# Patient Record
Sex: Female | Born: 1960 | Race: Black or African American | Hispanic: No | Marital: Single | Smoking: Current every day smoker
Health system: Southern US, Community
[De-identification: ages and names within clinical notes are randomized; demographics above are authoritative.]

## PROBLEM LIST (undated history)

## (undated) DIAGNOSIS — I10 Essential (primary) hypertension: Secondary | ICD-10-CM

## (undated) DIAGNOSIS — F341 Dysthymic disorder: Secondary | ICD-10-CM

## (undated) DIAGNOSIS — Z5189 Encounter for other specified aftercare: Secondary | ICD-10-CM

## (undated) DIAGNOSIS — E079 Disorder of thyroid, unspecified: Secondary | ICD-10-CM

## (undated) DIAGNOSIS — D649 Anemia, unspecified: Secondary | ICD-10-CM

## (undated) HISTORY — DX: Encounter for other specified aftercare: Z51.89

## (undated) HISTORY — PX: FRACTURE SURGERY: SHX138

## (undated) HISTORY — DX: Anemia, unspecified: D64.9

## (undated) HISTORY — DX: Dysthymic disorder: F34.1

## (undated) HISTORY — DX: Disorder of thyroid, unspecified: E07.9

## (undated) HISTORY — DX: Essential (primary) hypertension: I10

## (undated) HISTORY — PX: EYE SURGERY: SHX253

---

## 2003-10-18 ENCOUNTER — Other Ambulatory Visit: Admission: RE | Admit: 2003-10-18 | Discharge: 2003-10-18 | Payer: Self-pay | Admitting: Family Medicine

## 2003-11-11 ENCOUNTER — Ambulatory Visit (HOSPITAL_COMMUNITY): Admission: RE | Admit: 2003-11-11 | Discharge: 2003-11-11 | Payer: Self-pay | Admitting: Family Medicine

## 2003-12-26 ENCOUNTER — Ambulatory Visit (HOSPITAL_COMMUNITY): Admission: RE | Admit: 2003-12-26 | Discharge: 2003-12-26 | Payer: Self-pay | Admitting: Internal Medicine

## 2004-02-24 ENCOUNTER — Ambulatory Visit (HOSPITAL_COMMUNITY): Admission: RE | Admit: 2004-02-24 | Discharge: 2004-02-24 | Payer: Self-pay | Admitting: Nurse Practitioner

## 2004-03-28 ENCOUNTER — Encounter: Admission: RE | Admit: 2004-03-28 | Discharge: 2004-05-11 | Payer: Self-pay | Admitting: Nurse Practitioner

## 2004-06-25 ENCOUNTER — Encounter (INDEPENDENT_AMBULATORY_CARE_PROVIDER_SITE_OTHER): Payer: Self-pay | Admitting: Nurse Practitioner

## 2004-06-25 LAB — CONVERTED CEMR LAB: TSH: 0.743 microintl units/mL

## 2004-08-09 ENCOUNTER — Ambulatory Visit: Payer: Self-pay | Admitting: Nurse Practitioner

## 2004-08-13 ENCOUNTER — Ambulatory Visit: Payer: Self-pay | Admitting: Nurse Practitioner

## 2004-08-20 ENCOUNTER — Ambulatory Visit: Payer: Self-pay | Admitting: Nurse Practitioner

## 2004-08-27 ENCOUNTER — Ambulatory Visit: Payer: Self-pay | Admitting: Nurse Practitioner

## 2004-10-31 ENCOUNTER — Other Ambulatory Visit: Admission: RE | Admit: 2004-10-31 | Discharge: 2004-10-31 | Payer: Self-pay | Admitting: Family Medicine

## 2004-10-31 ENCOUNTER — Ambulatory Visit: Payer: Self-pay | Admitting: Nurse Practitioner

## 2004-10-31 LAB — CONVERTED CEMR LAB

## 2004-11-05 ENCOUNTER — Ambulatory Visit: Payer: Self-pay | Admitting: Nurse Practitioner

## 2004-11-07 ENCOUNTER — Ambulatory Visit (HOSPITAL_COMMUNITY): Admission: RE | Admit: 2004-11-07 | Discharge: 2004-11-07 | Payer: Self-pay | Admitting: Nurse Practitioner

## 2004-11-07 ENCOUNTER — Ambulatory Visit: Payer: Self-pay | Admitting: Nurse Practitioner

## 2004-11-14 ENCOUNTER — Ambulatory Visit (HOSPITAL_COMMUNITY): Admission: RE | Admit: 2004-11-14 | Discharge: 2004-11-14 | Payer: Self-pay | Admitting: Family Medicine

## 2004-11-19 ENCOUNTER — Ambulatory Visit: Payer: Self-pay | Admitting: Internal Medicine

## 2004-12-03 ENCOUNTER — Ambulatory Visit (HOSPITAL_COMMUNITY): Admission: RE | Admit: 2004-12-03 | Discharge: 2004-12-03 | Payer: Self-pay | Admitting: Internal Medicine

## 2005-05-13 ENCOUNTER — Ambulatory Visit: Payer: Self-pay | Admitting: Family Medicine

## 2005-06-03 ENCOUNTER — Ambulatory Visit: Payer: Self-pay | Admitting: Nurse Practitioner

## 2005-06-24 ENCOUNTER — Ambulatory Visit: Payer: Self-pay | Admitting: Nurse Practitioner

## 2005-07-09 ENCOUNTER — Ambulatory Visit: Payer: Self-pay | Admitting: Nurse Practitioner

## 2005-08-05 ENCOUNTER — Ambulatory Visit: Payer: Self-pay | Admitting: Nurse Practitioner

## 2005-12-30 ENCOUNTER — Ambulatory Visit: Payer: Self-pay | Admitting: Nurse Practitioner

## 2006-02-03 ENCOUNTER — Ambulatory Visit (HOSPITAL_COMMUNITY): Admission: RE | Admit: 2006-02-03 | Discharge: 2006-02-03 | Payer: Self-pay | Admitting: Family Medicine

## 2006-02-10 ENCOUNTER — Ambulatory Visit: Payer: Self-pay | Admitting: Nurse Practitioner

## 2006-02-10 ENCOUNTER — Other Ambulatory Visit: Admission: RE | Admit: 2006-02-10 | Discharge: 2006-02-10 | Payer: Self-pay | Admitting: Family Medicine

## 2006-02-10 LAB — CONVERTED CEMR LAB: Urinalysis: ABNORMAL

## 2006-02-11 ENCOUNTER — Encounter (INDEPENDENT_AMBULATORY_CARE_PROVIDER_SITE_OTHER): Payer: Self-pay | Admitting: Nurse Practitioner

## 2007-02-06 ENCOUNTER — Ambulatory Visit (HOSPITAL_COMMUNITY): Admission: RE | Admit: 2007-02-06 | Discharge: 2007-02-06 | Payer: Self-pay | Admitting: Family Medicine

## 2007-05-26 ENCOUNTER — Encounter (INDEPENDENT_AMBULATORY_CARE_PROVIDER_SITE_OTHER): Payer: Self-pay | Admitting: Nurse Practitioner

## 2007-05-26 DIAGNOSIS — B159 Hepatitis A without hepatic coma: Secondary | ICD-10-CM | POA: Insufficient documentation

## 2007-05-26 DIAGNOSIS — I10 Essential (primary) hypertension: Secondary | ICD-10-CM

## 2007-05-26 DIAGNOSIS — A539 Syphilis, unspecified: Secondary | ICD-10-CM

## 2007-05-26 DIAGNOSIS — Z8619 Personal history of other infectious and parasitic diseases: Secondary | ICD-10-CM

## 2007-09-26 ENCOUNTER — Emergency Department (HOSPITAL_COMMUNITY): Admission: EM | Admit: 2007-09-26 | Discharge: 2007-09-26 | Payer: Self-pay | Admitting: Emergency Medicine

## 2007-12-25 ENCOUNTER — Encounter (INDEPENDENT_AMBULATORY_CARE_PROVIDER_SITE_OTHER): Payer: Self-pay | Admitting: Nurse Practitioner

## 2007-12-25 ENCOUNTER — Ambulatory Visit: Payer: Self-pay | Admitting: Family Medicine

## 2007-12-25 LAB — CONVERTED CEMR LAB
Albumin: 4.4 g/dL (ref 3.5–5.2)
BUN: 12 mg/dL (ref 6–23)
Calcium: 9.6 mg/dL (ref 8.4–10.5)
Chloride: 101 meq/L (ref 96–112)
Eosinophils Absolute: 0.1 10*3/uL (ref 0.0–0.7)
Glucose, Bld: 92 mg/dL (ref 70–99)
Lymphs Abs: 1.4 10*3/uL (ref 0.7–4.0)
MCV: 75.7 fL — ABNORMAL LOW (ref 78.0–100.0)
Monocytes Relative: 9 % (ref 3–12)
Neutro Abs: 3 10*3/uL (ref 1.7–7.7)
Neutrophils Relative %: 62 % (ref 43–77)
Potassium: 3.7 meq/L (ref 3.5–5.3)
RBC: 4.69 M/uL (ref 3.87–5.11)
RPR Ser Ql: REACTIVE — AB
RPR Titer: 1:4 {titer}
TSH: 0.734 microintl units/mL (ref 0.350–5.50)
WBC: 4.9 10*3/uL (ref 4.0–10.5)

## 2008-01-15 ENCOUNTER — Ambulatory Visit: Payer: Self-pay | Admitting: Internal Medicine

## 2008-01-22 ENCOUNTER — Ambulatory Visit: Payer: Self-pay | Admitting: Internal Medicine

## 2008-02-05 ENCOUNTER — Ambulatory Visit: Payer: Self-pay | Admitting: Internal Medicine

## 2008-02-19 ENCOUNTER — Ambulatory Visit (HOSPITAL_COMMUNITY): Admission: RE | Admit: 2008-02-19 | Discharge: 2008-02-19 | Payer: Self-pay | Admitting: Family Medicine

## 2008-03-04 ENCOUNTER — Encounter (INDEPENDENT_AMBULATORY_CARE_PROVIDER_SITE_OTHER): Payer: Self-pay | Admitting: Nurse Practitioner

## 2008-03-04 ENCOUNTER — Other Ambulatory Visit: Admission: RE | Admit: 2008-03-04 | Discharge: 2008-03-04 | Payer: Self-pay | Admitting: Family Medicine

## 2008-03-04 ENCOUNTER — Ambulatory Visit: Payer: Self-pay | Admitting: Family Medicine

## 2008-03-04 LAB — CONVERTED CEMR LAB
ALT: 11 units/L (ref 0–35)
AST: 16 units/L (ref 0–37)
Basophils Absolute: 0 10*3/uL (ref 0.0–0.1)
CO2: 23 meq/L (ref 19–32)
Calcium: 9.5 mg/dL (ref 8.4–10.5)
Chloride: 106 meq/L (ref 96–112)
Lymphocytes Relative: 37 % (ref 12–46)
Neutro Abs: 2.3 10*3/uL (ref 1.7–7.7)
Platelets: 309 10*3/uL (ref 150–400)
RDW: 18.4 % — ABNORMAL HIGH (ref 11.5–15.5)
Sodium: 141 meq/L (ref 135–145)
TSH: 0.334 microintl units/mL — ABNORMAL LOW (ref 0.350–5.50)
Total Bilirubin: 0.4 mg/dL (ref 0.3–1.2)
Total Protein: 7.1 g/dL (ref 6.0–8.3)

## 2008-03-11 ENCOUNTER — Encounter: Admission: RE | Admit: 2008-03-11 | Discharge: 2008-03-11 | Payer: Self-pay | Admitting: Family Medicine

## 2008-09-10 ENCOUNTER — Emergency Department (HOSPITAL_COMMUNITY): Admission: EM | Admit: 2008-09-10 | Discharge: 2008-09-10 | Payer: Self-pay | Admitting: Emergency Medicine

## 2008-12-29 ENCOUNTER — Emergency Department (HOSPITAL_COMMUNITY): Admission: EM | Admit: 2008-12-29 | Discharge: 2008-12-29 | Payer: Self-pay | Admitting: Emergency Medicine

## 2009-01-18 ENCOUNTER — Ambulatory Visit: Payer: Self-pay | Admitting: Internal Medicine

## 2009-01-18 ENCOUNTER — Encounter (INDEPENDENT_AMBULATORY_CARE_PROVIDER_SITE_OTHER): Payer: Self-pay | Admitting: Internal Medicine

## 2009-01-18 LAB — CONVERTED CEMR LAB
ALT: 9 units/L (ref 0–35)
AST: 12 units/L (ref 0–37)
Albumin: 4.2 g/dL (ref 3.5–5.2)
Alkaline Phosphatase: 84 units/L (ref 39–117)
BUN: 11 mg/dL (ref 6–23)
Basophils Relative: 0 % (ref 0–1)
CO2: 23 meq/L (ref 19–32)
Creatinine, Ser: 0.91 mg/dL (ref 0.40–1.20)
Eosinophils Absolute: 0.1 10*3/uL (ref 0.0–0.7)
Eosinophils Relative: 2 % (ref 0–5)
Glucose, Bld: 94 mg/dL (ref 70–99)
HCT: 29.8 % — ABNORMAL LOW (ref 36.0–46.0)
MCHC: 31.2 g/dL (ref 30.0–36.0)
MCV: 71.6 fL — ABNORMAL LOW (ref 78.0–100.0)
Monocytes Absolute: 0.3 10*3/uL (ref 0.1–1.0)
Monocytes Relative: 7 % (ref 3–12)
Neutrophils Relative %: 60 % (ref 43–77)
RBC: 4.16 M/uL (ref 3.87–5.11)
Total Bilirubin: 0.3 mg/dL (ref 0.3–1.2)
Total Protein: 6.6 g/dL (ref 6.0–8.3)

## 2009-01-26 ENCOUNTER — Ambulatory Visit: Payer: Self-pay | Admitting: Internal Medicine

## 2009-02-09 ENCOUNTER — Ambulatory Visit: Payer: Self-pay | Admitting: Internal Medicine

## 2009-02-09 ENCOUNTER — Encounter (INDEPENDENT_AMBULATORY_CARE_PROVIDER_SITE_OTHER): Payer: Self-pay | Admitting: Internal Medicine

## 2009-02-15 ENCOUNTER — Ambulatory Visit (HOSPITAL_COMMUNITY): Admission: RE | Admit: 2009-02-15 | Discharge: 2009-02-15 | Payer: Self-pay | Admitting: Internal Medicine

## 2009-02-20 ENCOUNTER — Encounter: Admission: RE | Admit: 2009-02-20 | Discharge: 2009-02-20 | Payer: Self-pay | Admitting: *Deleted

## 2009-02-24 ENCOUNTER — Ambulatory Visit: Payer: Self-pay | Admitting: Internal Medicine

## 2009-02-24 ENCOUNTER — Encounter (INDEPENDENT_AMBULATORY_CARE_PROVIDER_SITE_OTHER): Payer: Self-pay | Admitting: Internal Medicine

## 2009-02-24 LAB — CONVERTED CEMR LAB
Basophils Absolute: 0 K/uL
Basophils Relative: 0 %
Eosinophils Absolute: 0.2 K/uL
Eosinophils Relative: 3 %
HCT: 25.3 % — ABNORMAL LOW
Hemoglobin: 7.6 g/dL — CL
Lymphocytes Relative: 21 %
Lymphs Abs: 1.4 K/uL
MCHC: 30 g/dL
MCV: 77.6 fL — ABNORMAL LOW
Monocytes Absolute: 0.6 K/uL
Monocytes Relative: 9 %
Neutro Abs: 4.6 K/uL
Neutrophils Relative %: 68 %
Platelets: 277 K/uL
RBC: 3.26 M/uL — ABNORMAL LOW
RDW: 22.3 % — ABNORMAL HIGH
WBC: 6.8 10*3/microliter

## 2009-02-28 ENCOUNTER — Encounter (INDEPENDENT_AMBULATORY_CARE_PROVIDER_SITE_OTHER): Payer: Self-pay | Admitting: Internal Medicine

## 2009-02-28 ENCOUNTER — Ambulatory Visit: Payer: Self-pay | Admitting: Internal Medicine

## 2009-02-28 LAB — CONVERTED CEMR LAB
Basophils Relative: 0 % (ref 0–1)
Eosinophils Absolute: 0.1 10*3/uL (ref 0.0–0.7)
MCHC: 32.8 g/dL (ref 30.0–36.0)
MCV: 73.6 fL — ABNORMAL LOW (ref 78.0–100.0)
Monocytes Relative: 8 % (ref 3–12)
Neutrophils Relative %: 69 % (ref 43–77)
RBC: 3.11 M/uL — ABNORMAL LOW (ref 3.87–5.11)

## 2009-03-02 ENCOUNTER — Emergency Department (HOSPITAL_COMMUNITY): Admission: EM | Admit: 2009-03-02 | Discharge: 2009-03-02 | Payer: Self-pay | Admitting: Emergency Medicine

## 2009-03-08 ENCOUNTER — Other Ambulatory Visit: Admission: RE | Admit: 2009-03-08 | Discharge: 2009-03-08 | Payer: Self-pay | Admitting: Obstetrics & Gynecology

## 2009-03-08 ENCOUNTER — Ambulatory Visit: Payer: Self-pay | Admitting: Obstetrics & Gynecology

## 2009-03-29 ENCOUNTER — Ambulatory Visit: Payer: Self-pay | Admitting: Obstetrics and Gynecology

## 2009-03-29 ENCOUNTER — Ambulatory Visit: Payer: Self-pay | Admitting: Internal Medicine

## 2009-04-05 ENCOUNTER — Ambulatory Visit: Payer: Self-pay | Admitting: Internal Medicine

## 2009-06-02 ENCOUNTER — Ambulatory Visit: Payer: Self-pay | Admitting: Obstetrics & Gynecology

## 2009-06-02 ENCOUNTER — Ambulatory Visit: Payer: Self-pay | Admitting: Internal Medicine

## 2009-07-11 ENCOUNTER — Ambulatory Visit: Payer: Self-pay | Admitting: Internal Medicine

## 2009-07-11 ENCOUNTER — Encounter (INDEPENDENT_AMBULATORY_CARE_PROVIDER_SITE_OTHER): Payer: Self-pay | Admitting: Internal Medicine

## 2009-07-11 LAB — CONVERTED CEMR LAB
Basophils Absolute: 0 10*3/uL (ref 0.0–0.1)
Basophils Relative: 0 % (ref 0–1)
Eosinophils Absolute: 0 10*3/uL (ref 0.0–0.7)
Eosinophils Relative: 1 % (ref 0–5)
HCT: 35.2 % — ABNORMAL LOW (ref 36.0–46.0)
Hemoglobin: 11.7 g/dL — ABNORMAL LOW (ref 12.0–15.0)
MCHC: 33.2 g/dL (ref 30.0–36.0)
Monocytes Absolute: 0.3 10*3/uL (ref 0.1–1.0)
Neutro Abs: 1.8 10*3/uL (ref 1.7–7.7)
RDW: 15.2 % (ref 11.5–15.5)

## 2009-07-25 ENCOUNTER — Ambulatory Visit (HOSPITAL_COMMUNITY): Admission: RE | Admit: 2009-07-25 | Discharge: 2009-07-25 | Payer: Self-pay | Admitting: Internal Medicine

## 2009-07-25 ENCOUNTER — Encounter: Payer: Self-pay | Admitting: Internal Medicine

## 2009-08-10 ENCOUNTER — Encounter (INDEPENDENT_AMBULATORY_CARE_PROVIDER_SITE_OTHER): Payer: Self-pay | Admitting: Internal Medicine

## 2009-08-10 ENCOUNTER — Ambulatory Visit: Payer: Self-pay | Admitting: Internal Medicine

## 2009-08-10 LAB — CONVERTED CEMR LAB
Basophils Relative: 0 % (ref 0–1)
Eosinophils Absolute: 0 10*3/uL (ref 0.0–0.7)
LDL Cholesterol: 128 mg/dL — ABNORMAL HIGH (ref 0–99)
MCHC: 31.4 g/dL (ref 30.0–36.0)
MCV: 85.2 fL (ref 78.0–100.0)
Neutrophils Relative %: 53 % (ref 43–77)
Platelets: 229 10*3/uL (ref 150–400)
Total CHOL/HDL Ratio: 3.8

## 2009-08-25 ENCOUNTER — Encounter (INDEPENDENT_AMBULATORY_CARE_PROVIDER_SITE_OTHER): Payer: Self-pay | Admitting: Internal Medicine

## 2009-08-25 ENCOUNTER — Ambulatory Visit: Payer: Self-pay | Admitting: Internal Medicine

## 2009-08-25 LAB — CONVERTED CEMR LAB
Iron: 19 ug/dL — ABNORMAL LOW (ref 42–145)
Saturation Ratios: 5 % — ABNORMAL LOW (ref 20–55)
UIBC: 378 ug/dL
Vitamin B-12: 262 pg/mL (ref 211–911)

## 2009-08-28 ENCOUNTER — Encounter (INDEPENDENT_AMBULATORY_CARE_PROVIDER_SITE_OTHER): Payer: Self-pay | Admitting: Internal Medicine

## 2009-09-11 ENCOUNTER — Encounter (INDEPENDENT_AMBULATORY_CARE_PROVIDER_SITE_OTHER): Payer: Self-pay | Admitting: Internal Medicine

## 2009-09-11 ENCOUNTER — Ambulatory Visit: Payer: Self-pay | Admitting: Internal Medicine

## 2009-09-27 ENCOUNTER — Ambulatory Visit: Payer: Self-pay | Admitting: Internal Medicine

## 2009-10-25 ENCOUNTER — Ambulatory Visit: Payer: Self-pay | Admitting: Internal Medicine

## 2009-11-21 ENCOUNTER — Ambulatory Visit: Payer: Self-pay | Admitting: Family Medicine

## 2009-11-21 ENCOUNTER — Ambulatory Visit (HOSPITAL_COMMUNITY): Admission: RE | Admit: 2009-11-21 | Discharge: 2009-11-21 | Payer: Self-pay | Admitting: Family Medicine

## 2009-12-21 ENCOUNTER — Ambulatory Visit: Payer: Self-pay | Admitting: Family Medicine

## 2010-01-01 ENCOUNTER — Ambulatory Visit: Payer: Self-pay | Admitting: Family Medicine

## 2010-02-21 ENCOUNTER — Encounter: Admission: RE | Admit: 2010-02-21 | Discharge: 2010-02-21 | Payer: Self-pay | Admitting: Family Medicine

## 2010-02-22 ENCOUNTER — Ambulatory Visit: Payer: Self-pay | Admitting: Internal Medicine

## 2010-02-22 LAB — CONVERTED CEMR LAB
CO2: 26 meq/L (ref 19–32)
Calcium: 10.2 mg/dL (ref 8.4–10.5)
Chloride: 106 meq/L (ref 96–112)
Glucose, Bld: 93 mg/dL (ref 70–99)
Sodium: 141 meq/L (ref 135–145)

## 2010-03-02 ENCOUNTER — Ambulatory Visit: Payer: Self-pay | Admitting: Internal Medicine

## 2010-03-02 LAB — CONVERTED CEMR LAB
ALT: 15 units/L (ref 0–35)
AST: 16 units/L (ref 0–37)
Albumin: 4.3 g/dL (ref 3.5–5.2)
CO2: 25 meq/L (ref 19–32)
Calcium: 9.8 mg/dL (ref 8.4–10.5)
Chloride: 105 meq/L (ref 96–112)
Creatinine, Ser: 0.9 mg/dL (ref 0.40–1.20)
Ferritin: 36 ng/mL (ref 10–291)
Potassium: 3.6 meq/L (ref 3.5–5.3)
Saturation Ratios: 34 % (ref 20–55)
Total Protein: 6.8 g/dL (ref 6.0–8.3)

## 2010-03-09 ENCOUNTER — Ambulatory Visit: Payer: Self-pay | Admitting: Internal Medicine

## 2010-03-19 ENCOUNTER — Ambulatory Visit (HOSPITAL_COMMUNITY): Admission: RE | Admit: 2010-03-19 | Discharge: 2010-03-19 | Payer: Self-pay | Admitting: Internal Medicine

## 2010-03-26 ENCOUNTER — Ambulatory Visit: Payer: Self-pay | Admitting: Internal Medicine

## 2010-04-23 ENCOUNTER — Other Ambulatory Visit: Admission: RE | Admit: 2010-04-23 | Discharge: 2010-04-23 | Payer: Self-pay | Admitting: Internal Medicine

## 2010-04-23 ENCOUNTER — Ambulatory Visit: Payer: Self-pay | Admitting: Internal Medicine

## 2010-04-24 ENCOUNTER — Encounter (INDEPENDENT_AMBULATORY_CARE_PROVIDER_SITE_OTHER): Payer: Self-pay | Admitting: Internal Medicine

## 2010-05-08 ENCOUNTER — Ambulatory Visit: Payer: Self-pay | Admitting: Internal Medicine

## 2010-05-22 ENCOUNTER — Ambulatory Visit: Payer: Self-pay | Admitting: Internal Medicine

## 2010-05-30 ENCOUNTER — Ambulatory Visit: Payer: Self-pay | Admitting: Internal Medicine

## 2010-06-20 ENCOUNTER — Ambulatory Visit: Payer: Self-pay | Admitting: Internal Medicine

## 2010-07-12 ENCOUNTER — Ambulatory Visit: Payer: Self-pay | Admitting: Internal Medicine

## 2010-08-16 ENCOUNTER — Emergency Department (HOSPITAL_COMMUNITY): Admission: EM | Admit: 2010-08-16 | Discharge: 2010-08-16 | Payer: Self-pay | Admitting: Emergency Medicine

## 2010-08-16 ENCOUNTER — Ambulatory Visit: Payer: Self-pay | Admitting: Internal Medicine

## 2010-10-12 ENCOUNTER — Ambulatory Visit: Payer: Self-pay | Admitting: Obstetrics & Gynecology

## 2010-10-18 IMAGING — CT CT HEAD W/O CM
1 of 2 series · 13 of 30 positions shown, 17 images · non-contrast
Comparison: None.

CLINICAL DATA: Migraine headache.

CT HEAD WITHOUT CONTRAST
TECHNIQUE: Contiguous axial images were obtained from the base of
the skull through the vertex without contrast

[Series 2: brain · axial · 0.49mm/px · z∈[+122,+255]mm · 13 of 40 slices shown, 17 images]
[im 3/40  brain]
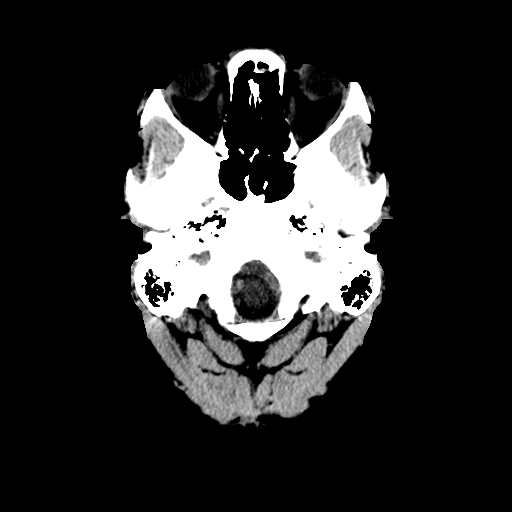
[im 3/40  bone]
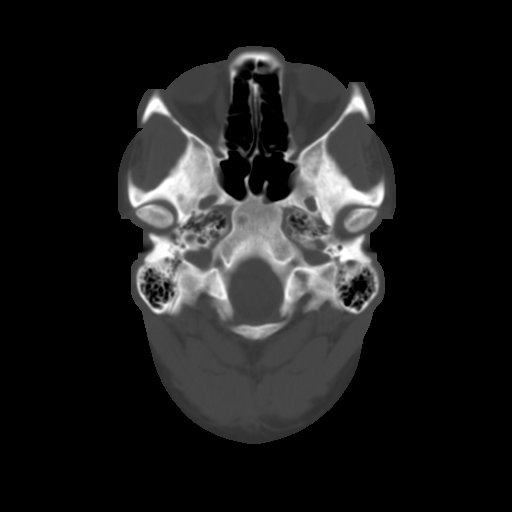
[im 6/40  brain]
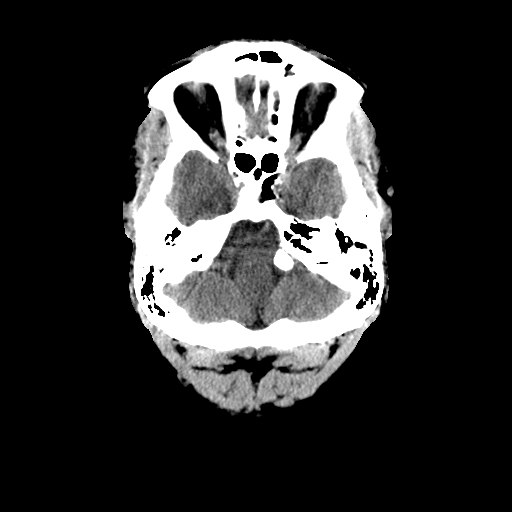
[im 9/40  brain]
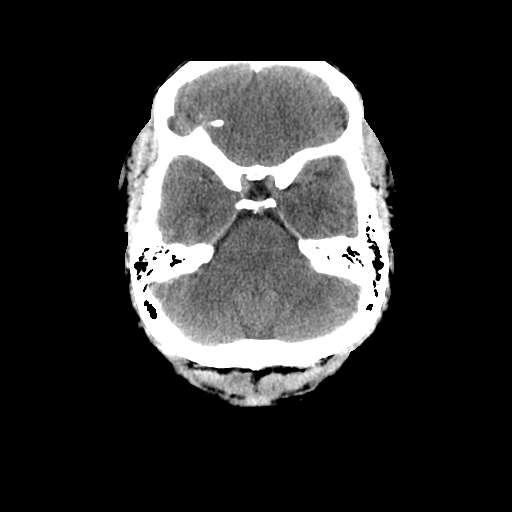
[im 12/40  brain]
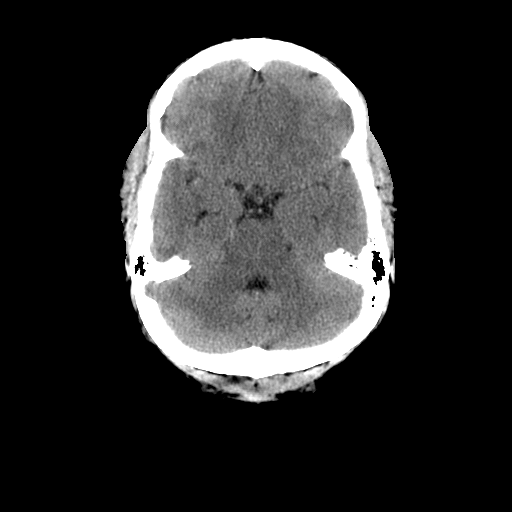
[im 14/40  brain]
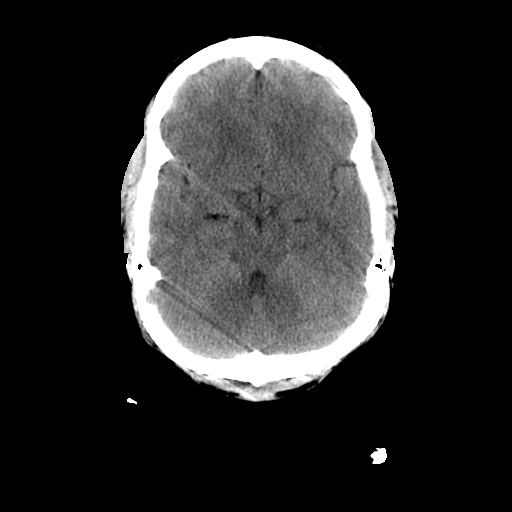
[im 14/40  bone]
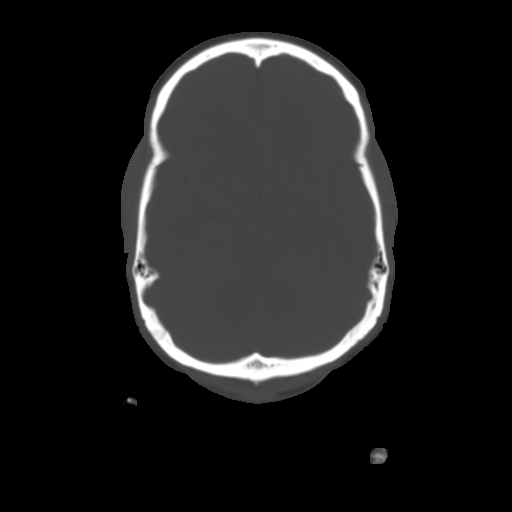
[im 17/40  brain]
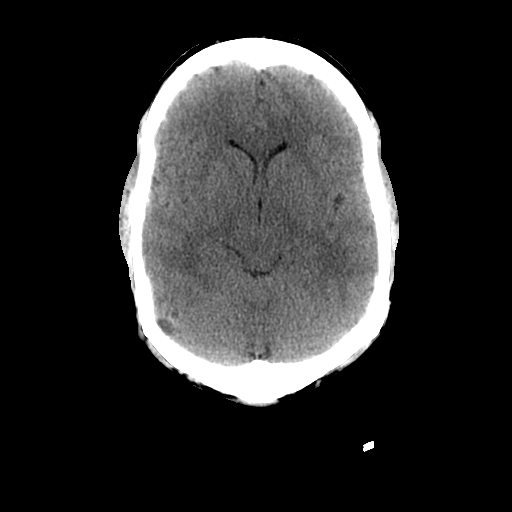
[im 20/40  brain]
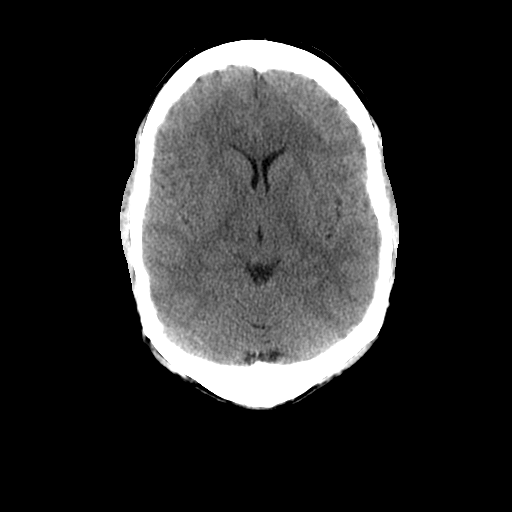
[im 23/40  brain]
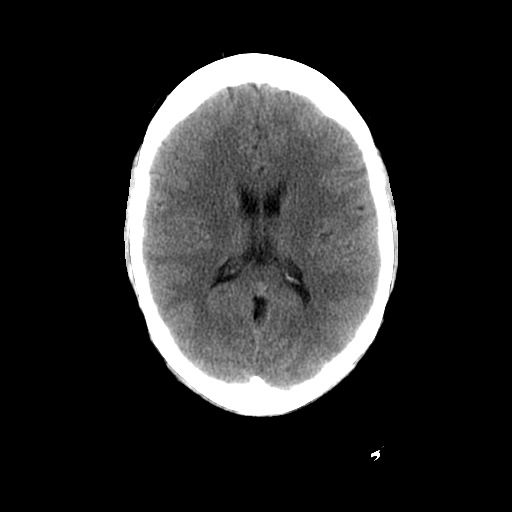
[im 26/40  brain]
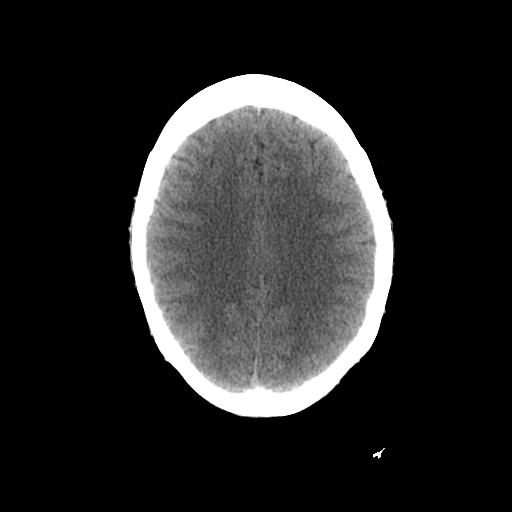
[im 26/40  bone]
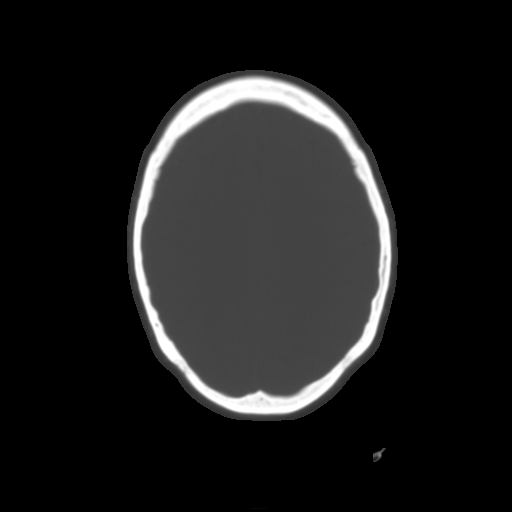
[im 28/40  brain]
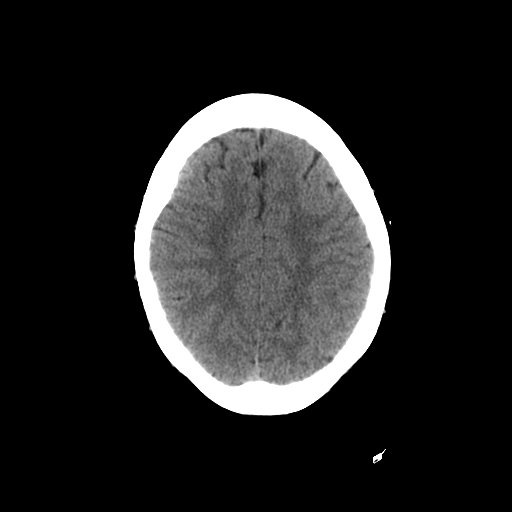
[im 31/40  brain]
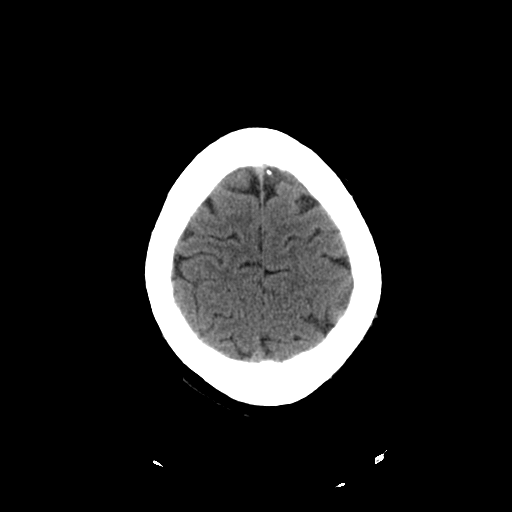
[im 34/40  brain]
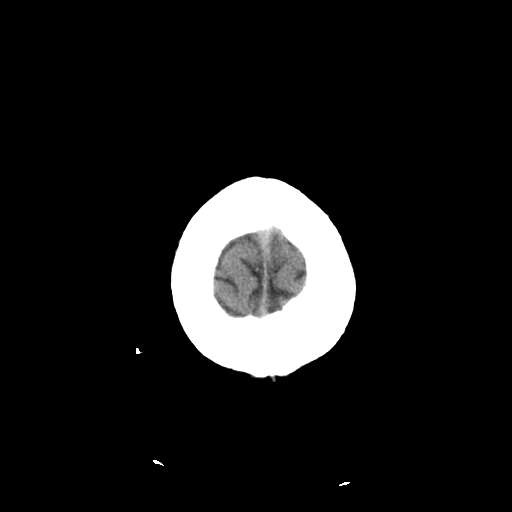
[im 37/40  brain]
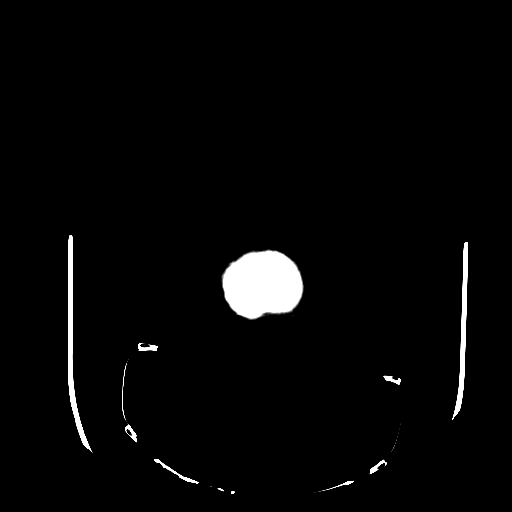
[im 37/40  bone]
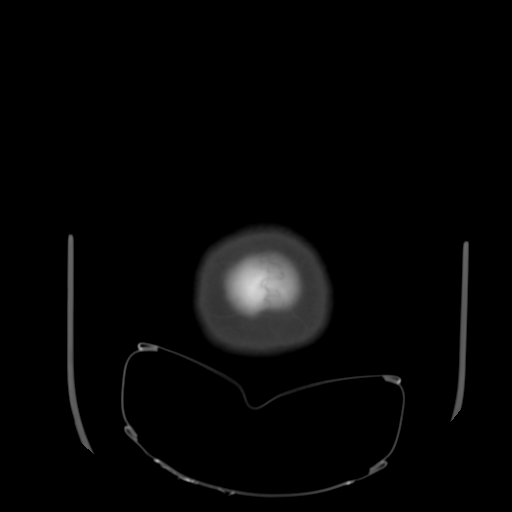

[13 of 30 positions shown; findings below may reference images not displayed]

FINDINGS: The brain has a normal appearance without evidence for
hemorrhage, acute infarction, hydrocephalus, or mass lesion.  There
is no extra axial fluid collection.  The skull and paranasal
sinuses are normal.
IMPRESSION: Normal CT of the head without contrast.

## 2011-01-21 ENCOUNTER — Other Ambulatory Visit: Payer: Self-pay | Admitting: Family Medicine

## 2011-01-21 ENCOUNTER — Other Ambulatory Visit: Payer: Self-pay | Admitting: Dermatology

## 2011-01-21 DIAGNOSIS — Z1231 Encounter for screening mammogram for malignant neoplasm of breast: Secondary | ICD-10-CM

## 2011-02-13 LAB — URINE CULTURE
Colony Count: NO GROWTH
Culture: NO GROWTH

## 2011-02-13 LAB — POCT I-STAT, CHEM 8
BUN: 12 mg/dL (ref 6–23)
Calcium, Ion: 1.14 mmol/L (ref 1.12–1.32)
HCT: 26 % — ABNORMAL LOW (ref 36.0–46.0)
Hemoglobin: 8.8 g/dL — ABNORMAL LOW (ref 12.0–15.0)
TCO2: 26 mmol/L (ref 0–100)

## 2011-02-13 LAB — URINALYSIS, ROUTINE W REFLEX MICROSCOPIC
Bilirubin Urine: NEGATIVE
Glucose, UA: NEGATIVE mg/dL
Hgb urine dipstick: NEGATIVE
Ketones, ur: NEGATIVE mg/dL
Protein, ur: NEGATIVE mg/dL
Urobilinogen, UA: 0.2 mg/dL (ref 0.0–1.0)

## 2011-02-13 LAB — POCT PREGNANCY, URINE: Preg Test, Ur: NEGATIVE

## 2011-02-18 ENCOUNTER — Other Ambulatory Visit: Payer: Self-pay | Admitting: Gastroenterology

## 2011-02-25 ENCOUNTER — Ambulatory Visit
Admission: RE | Admit: 2011-02-25 | Discharge: 2011-02-25 | Disposition: A | Discharge status: Home / Self Care | Payer: Medicaid Other | Source: Ambulatory Visit | Attending: Family Medicine | Admitting: Family Medicine

## 2011-02-25 DIAGNOSIS — Z1231 Encounter for screening mammogram for malignant neoplasm of breast: Secondary | ICD-10-CM

## 2011-03-19 NOTE — Group Therapy Note (Signed)
[name], [name] NO.:  [id number]   MEDICAL RECORD NO.:  [id number]          PATIENT TYPE:  WOC   LOCATION:  [facility]                   FACILITY:  [facility]   PHYSICIAN:  Elsie Lincoln, MD      DATE OF BIRTH:  08/28/1961   DATE OF SERVICE:  06/02/2009                                  CLINIC NOTE   REASON FOR VISIT:  Results.   HISTORY:  This is a 50 year old who is here for results of her  endometrial biopsy, which was done here on Mar 29, 2009.  She had a  single episode of 2 months of heavy vaginal bleeding, which precipitated  her referral here from [facility] where she gets her primary care  including her Pap smears and contraception.  She was just advised to  keep her menstrual and bleeding calendar.  After her visit here 2 months  ago, her endometrial biopsy was negative and she is reassured about  this.  She is also concerned about having a sister-in-law, who had  ovarian cancer and she is concerned also about the multiple small  fibroids which were noted on ultrasound.  She states she is taking her  iron and her antihypertensives as directed.  She is aware that her blood  pressure is elevated and does have an appointment with her primary  physician at [facility].  Of note, she is on Loestrin and does not want  to change to a better method, although she is informed that with her  hypertension, smoking less than a half pack a day and her age, she would  be better off on a non-estrogen-containing contraceptives.   OBJECTIVE:  She is obese in no distress.  BP 183/86.  Also, of note, at  her last visit 2 months ago, it was 174/96.  Physical exam is deferred  today.   ASSESSMENT:  Single episode of abnormal uterine bleeding could be  perimenopausal, possibly related to her small fibroids; iron-deficiency  anemia, on supplementation appropriately, contraceptives method is  suboptimal for this patient.   PLAN:  Discussed her ultrasound findings in detail  again with  illustrations to reassure her.  Also discussed other options for  contraception, and she will bring this up with her primary physician Dr.  Eduard Roux at [facility] with whom she has an appointment today.  She is to  continue to keep her menstrual bleeding calendar and is reassured that  since her periods have been regular since she had this episodes of heavy  bleeding in March and April, we will do watchful waiting for now and if  this recurs and is not responding to medical management, an endometrial  ablation could be an option for her.  Also on her followup in 6 months,  we will see about changing her contraceptive methods.     ______________________________  Caren Griffins, CNM    ______________________________  Elsie Lincoln, MD   DP/MEDQ  D:  06/02/2009  T:  06/03/2009  Job:  [id number]

## 2011-03-19 NOTE — Group Therapy Note (Signed)
[name], [name] NO.:  [id number]   MEDICAL RECORD NO.:  [id number]          PATIENT TYPE:  WOC   LOCATION:  [facility]                   FACILITY:  [facility]   PHYSICIAN:  Mary Donovan, MD        DATE OF BIRTH:  [birth year]   DATE OF SERVICE:  03/08/2009                                  CLINIC NOTE   CHIEF COMPLAINT:  Abnormal vaginal bleeding.   HISTORY OF PRESENT ILLNESS:  This is a 50 year old African-American  female who was sent here as a referral from [facility] clinic for 2  months of heavy vaginal bleeding.  She reports that she has had normal  periods until she skipped 2 periods in January and February and then  proceeded to bleed for 2 months straight from March until April 28.  The  bleeding was heavy with clots.  She had never bled like that before and  had never skipped a period before.  She went to the emergency room for  this bleeding where she was found to have a hemoglobin of 8.0 and has  been taking iron 3 times a day.  She also had a negative pregnancy test  in the emergency room but states that she has not been sexually active  recently.  She was sent here today for evaluation of her dysfunctional  uterine bleeding with the possibility of an endometrial biopsy.   PAST MEDICAL HISTORY:  1. Hypertension.  2. Iron-deficiency anemia.   CURRENT MEDICATIONS:  1. Iron pills 3 times daily.  2. Blood pressure medicine, name unknown.   MENSTRUAL HISTORY:  She reports that she started having periods at the  age of 65.  Her usual period length is 7 days with a moderate amount of  flow.   OBSTETRICAL HISTORY:  She is G5, P4-0-1-4 with a history of 1 cesarean  section.   PAST GYNECOLOGICAL HISTORY:  She reports that she had a normal Pap smear  in April 2010 at [facility].  She has never had an abnormal Pap  smear.   PAST SURGICAL HISTORY:  None.   FAMILY HISTORY:  Pertinent for high blood pressure in multiple family  members.   SOCIAL HISTORY:  She lives with her 2 sons.  She smokes half a pack of  cigarettes a day for the last 20 years.   REVIEW OF SYSTEMS:  As documented in the chart.  It is positive for  fatigue, hot flashes, vaginal bleeding, and pain with intercourse.   VITAL SIGNS:  Temperature 98.5, pulse 79, blood pressure 183/99,  respiratory rate 20, weight 195 pounds, height is 59 inches.   PHYSICAL EXAMINATION:  GENERAL:  She is healthy appearing in no acute  distress and is very pleasant.  CARDIOVASCULAR EXAM:  Reveals regular rate and rhythm with a 2/6  systolic ejection murmur.  LUNGS:  Are clear to auscultation bilaterally with no wheezes or  rhonchi.  ABDOMEN:  Soft, nontender, nondistended.  No masses.  No rebound.  No  guarding.  However, she is obese.  EXTREMITIES:  Are warm without edema.  GENITOURINARY EXAM:  Reveals a normal introitus.  No  vaginal discharge.  Cervix is posterior without any bleeding.  Bimanual exam reveals no  cervical motion tenderness and no adnexal masses or tenderness.   ASSESSMENT:  This is a 50 year old African-American female with new  onset of dysfunctional uterine bleeding.  We discussed the purpose and  risk of endometrial biopsy.  Consent was signed and placed on the chart.  A sterile speculum was inserted into the vagina.  Tenaculum was placed  on the anterior lip of the cervix, and the uterus was sounded to 9 cm.  Endometrial biopsy pipette was inserted, and a good sample of the  endometrial lining was obtained.  The patient did have minimal bleeding  but tolerated the procedure well.   PLAN:  The patient was instructed that she will experience some  menstrual-like cramping and discomfort for which she can Tylenol.  She  can also expect some bleeding or spotting for the next several days but  should call the clinic should she have any heavy bleeding.  She will  return to the office in 2 weeks for the results of her endometrial  biopsy.      ______________________________  [name], M.D.    ______________________________  Mary Donovan, MD    MJ/MEDQ  D:  03/08/2009  T:  03/08/2009  Job:  [id number]

## 2011-05-04 DIAGNOSIS — I10 Essential (primary) hypertension: Secondary | ICD-10-CM | POA: Insufficient documentation

## 2011-05-04 DIAGNOSIS — D259 Leiomyoma of uterus, unspecified: Secondary | ICD-10-CM | POA: Insufficient documentation

## 2011-05-04 DIAGNOSIS — D509 Iron deficiency anemia, unspecified: Secondary | ICD-10-CM | POA: Insufficient documentation

## 2011-05-04 DIAGNOSIS — Z87891 Personal history of nicotine dependence: Secondary | ICD-10-CM | POA: Insufficient documentation

## 2011-05-04 DIAGNOSIS — N938 Other specified abnormal uterine and vaginal bleeding: Secondary | ICD-10-CM

## 2011-05-24 ENCOUNTER — Encounter: Payer: Self-pay | Admitting: Obstetrics and Gynecology

## 2011-05-24 ENCOUNTER — Ambulatory Visit (INDEPENDENT_AMBULATORY_CARE_PROVIDER_SITE_OTHER): Payer: Medicaid Other | Admitting: Obstetrics and Gynecology

## 2011-05-24 VITALS — BP 191/101 | HR 68 | Temp 97.4°F | Ht 59.0 in | Wt 164.4 lb

## 2011-05-24 DIAGNOSIS — D259 Leiomyoma of uterus, unspecified: Secondary | ICD-10-CM | POA: Insufficient documentation

## 2011-05-24 NOTE — Progress Notes (Signed)
50yo [id number] presenting today for follow-up on her fibroid uterus. Patient was evaluated in September 2011 for an episode of DUB. An endometrial biopsy performed at that time was benign and a pelvic ultrasound revealed a 12 x4 x6 cm fibroid. Patient was offered medical management with Mirena IUD. Patient presents today reporting amenorrhea x 11 months and only wanting follow-up. Patient is currently without any complaints: denies pelvic pain, pressure or abnormal bleeding.  Patient was advised that in the absence of any symptoms, follow-up for asymptomatic fibroids is not indicated. Patient will continue to receive her care form [facility] and will return on a prn basis if vaginal bleeding returns. Patient has had her mammogram and a colonoscopy this year.

## 2011-08-06 LAB — POCT I-STAT, CHEM 8
BUN: 6
Calcium, Ion: 1.12
Chloride: 104
Creatinine, Ser: 1.2
Glucose, Bld: 87
HCT: 32 — ABNORMAL LOW
Hemoglobin: 10.9 — ABNORMAL LOW
Potassium: 3 — ABNORMAL LOW
Sodium: 140
TCO2: 30

## 2011-12-16 ENCOUNTER — Encounter (INDEPENDENT_AMBULATORY_CARE_PROVIDER_SITE_OTHER): Payer: Medicaid Other | Admitting: Ophthalmology

## 2012-01-20 ENCOUNTER — Other Ambulatory Visit: Payer: Self-pay | Admitting: Family Medicine

## 2012-01-20 DIAGNOSIS — Z1231 Encounter for screening mammogram for malignant neoplasm of breast: Secondary | ICD-10-CM

## 2012-01-22 ENCOUNTER — Encounter (INDEPENDENT_AMBULATORY_CARE_PROVIDER_SITE_OTHER): Payer: Medicaid Other | Admitting: Ophthalmology

## 2012-01-22 DIAGNOSIS — H33309 Unspecified retinal break, unspecified eye: Secondary | ICD-10-CM

## 2012-01-22 DIAGNOSIS — H43819 Vitreous degeneration, unspecified eye: Secondary | ICD-10-CM

## 2012-01-22 DIAGNOSIS — H3530 Unspecified macular degeneration: Secondary | ICD-10-CM

## 2012-01-22 DIAGNOSIS — H2510 Age-related nuclear cataract, unspecified eye: Secondary | ICD-10-CM

## 2012-01-22 DIAGNOSIS — H35039 Hypertensive retinopathy, unspecified eye: Secondary | ICD-10-CM

## 2012-01-22 DIAGNOSIS — I10 Essential (primary) hypertension: Secondary | ICD-10-CM

## 2012-02-27 ENCOUNTER — Ambulatory Visit: Payer: Medicaid Other

## 2012-03-27 ENCOUNTER — Ambulatory Visit
Admission: RE | Admit: 2012-03-27 | Discharge: 2012-03-27 | Disposition: A | Discharge status: Home / Self Care | Payer: Medicaid Other | Source: Ambulatory Visit | Attending: Family Medicine | Admitting: Family Medicine

## 2012-03-27 DIAGNOSIS — Z1231 Encounter for screening mammogram for malignant neoplasm of breast: Secondary | ICD-10-CM

## 2012-04-17 ENCOUNTER — Encounter: Payer: Self-pay | Admitting: Obstetrics and Gynecology

## 2012-04-17 ENCOUNTER — Other Ambulatory Visit (HOSPITAL_COMMUNITY)
Admission: RE | Admit: 2012-04-17 | Discharge: 2012-04-17 | Disposition: A | Discharge status: Home / Self Care | Payer: Medicaid Other | Source: Ambulatory Visit | Attending: Obstetrics and Gynecology | Admitting: Obstetrics and Gynecology

## 2012-04-17 ENCOUNTER — Ambulatory Visit (INDEPENDENT_AMBULATORY_CARE_PROVIDER_SITE_OTHER): Payer: Medicaid Other | Admitting: Obstetrics and Gynecology

## 2012-04-17 VITALS — BP 168/92 | HR 77 | Temp 98.7°F | Ht 59.0 in | Wt 194.6 lb

## 2012-04-17 DIAGNOSIS — Z01419 Encounter for gynecological examination (general) (routine) without abnormal findings: Secondary | ICD-10-CM | POA: Insufficient documentation

## 2012-04-17 DIAGNOSIS — R102 Pelvic and perineal pain: Secondary | ICD-10-CM

## 2012-04-17 DIAGNOSIS — N949 Unspecified condition associated with female genital organs and menstrual cycle: Secondary | ICD-10-CM

## 2012-04-17 NOTE — Progress Notes (Signed)
  Subjective:    Patient ID: [name], female    DOB: 11-Feb-1961, 51 y.o.   MRN: [id number]  HPI 51 yo [id number] postmenopausal x 3 years presenting today for follow-up on her fibroids. Patient states that a month ago she experienced a sharp pain in her lower abdomen which quickly resolved and had not happened again. She remembered being told that she has fibroid uterus and wanted to make sure that nothing has changes. Patient is otherwise without any other complaints. She is not sexually active. She has had her mammogram this year  Past Medical History  Diagnosis Date  . Hypertension   . Anemia   . Blood transfusion 21 years ago  . Thyroid disease    Past Surgical History  Procedure Date  . Fracture surgery     rods in leg 19 years ago  . Eye surgery    Family History  Problem Relation Age of Onset  . Hypertension Father   . Hypertension Paternal Grandmother    History  Substance Use Topics  . Smoking status: Current Everyday Smoker -- 0.5 packs/day for 30 years  . Smokeless tobacco: Not on file  . Alcohol Use: No    Review of Systems  All other systems reviewed and are negative.       Objective:   Physical Exam GENERAL: Well-developed, well-nourished female in no acute distress.  HEENT: Normocephalic, atraumatic. Sclerae anicteric.  NECK: Supple. Normal thyroid.  LUNGS: Clear to auscultation bilaterally.  HEART: Regular rate and rhythm. BREASTS: Symmetric in size. No palpable masses or lymphadenopathy, skin changes, or nipple drainage. ABDOMEN: Soft, nontender, nondistended. No organomegaly. PELVIC: Normal external female genitalia. Vagina is pink and rugated.  Normal discharge. Normal appearing cervix. Bimanual exam limited secondary body habitus. No adnexal mass or tenderness. EXTREMITIES: No cyanosis, clubbing, or edema, 2+ distal pulses.     Assessment & Plan:  51 yo with fibroid uterus - Benign exam - pap smear performed today - will schedule pelvic  ultrasound as her last one was in 2010.

## 2012-04-24 ENCOUNTER — Ambulatory Visit (HOSPITAL_COMMUNITY)
Admission: RE | Admit: 2012-04-24 | Discharge: 2012-04-24 | Disposition: A | Discharge status: Home / Self Care | Payer: Medicaid Other | Source: Ambulatory Visit | Attending: Obstetrics and Gynecology | Admitting: Obstetrics and Gynecology

## 2012-04-24 DIAGNOSIS — N949 Unspecified condition associated with female genital organs and menstrual cycle: Secondary | ICD-10-CM | POA: Insufficient documentation

## 2012-04-24 DIAGNOSIS — N950 Postmenopausal bleeding: Secondary | ICD-10-CM | POA: Insufficient documentation

## 2012-04-24 DIAGNOSIS — R102 Pelvic and perineal pain: Secondary | ICD-10-CM

## 2012-04-24 DIAGNOSIS — D251 Intramural leiomyoma of uterus: Secondary | ICD-10-CM | POA: Insufficient documentation

## 2012-05-15 ENCOUNTER — Ambulatory Visit: Payer: Medicaid Other | Admitting: Obstetrics & Gynecology

## 2012-05-21 ENCOUNTER — Ambulatory Visit (INDEPENDENT_AMBULATORY_CARE_PROVIDER_SITE_OTHER): Payer: Medicaid Other | Admitting: Obstetrics & Gynecology

## 2012-05-21 VITALS — BP 163/104 | HR 76 | Temp 98.3°F | Ht 59.0 in | Wt 191.9 lb

## 2012-05-21 DIAGNOSIS — D259 Leiomyoma of uterus, unspecified: Secondary | ICD-10-CM

## 2012-05-21 DIAGNOSIS — N840 Polyp of corpus uteri: Secondary | ICD-10-CM | POA: Insufficient documentation

## 2012-05-21 NOTE — Progress Notes (Signed)
Patient is here to follow up ultrasound for evaluation of fibroids.  04/24/2012  TRANSABDOMINAL AND TRANSVAGINAL ULTRASOUND OF PELVIS  Clinical Data: Pelvic pain.  Postmenopausal bleeding  Technique:  Both transabdominal and transvaginal ultrasound examinations of the pelvis were performed. Transabdominal technique was performed for global imaging of the pelvis including uterus, ovaries, adnexal regions, and pelvic cul-de-sac.  It was necessary to proceed with endovaginal exam following the transabdominal exam to visualize the myometrium, endometrium and adnexa.  Comparison:  02/15/2009  Findings:  Uterus: The uterus demonstrates a sagittal length of 9.6 cm, depth of 4.9 cm and width of 5.1 cm. The uterine myometrium is heterogeneous and demonstrates several small mural fibroids on the order of 1-2 cm in size.  Endometrium: Demonstrates an area of focal echogenic thickening in the region of the right fundus measuring 1.0 x 0.7 by 1.2 cm.  This shows questionable vascularity from a feeding vessel with power Doppler exam and the appearance is most suspicious for a focal polyp.  The remainder of the lining appears thin.  Right ovary:  Is not seen with confidence either transabdominally or endovaginally  Left ovary: Has a normal appearance measuring 2.7 x 1.2 x 1.0 cm  Other findings: A trace of simple free fluid is noted in the cul-de- sac.  IMPRESSION: Area of focal endometrial thickening with questionable flow from a feeding vessel suspicious for focal polyp. In a postmenopausal patient experiencing vaginal bleeding endometrial carcinoma cannot be excluded with certainty and further assessment with tissue sampling is warranted.  Normal left ovary and non-visualized right ovary  Multiple small mural fibroids.  Original Report Authenticated By: Bertha Stakes, M.D.   Results discussed with patient.  No intervention needed now.  No postmenopausal bleeding, no reason to resect the polyp for now.  Patient advised  to call if she has any bleeding, as this will warrant polypectomy and further evaluation. Patient desires to follow up in 6 months.  Total encounter time 15 minutes  Jaynie Collins, M.D. 05/21/2012 2:45 PM

## 2012-05-21 NOTE — Patient Instructions (Signed)
Return to clinic for any scheduled appointments or for any gynecologic concerns as needed.   

## 2012-07-08 ENCOUNTER — Other Ambulatory Visit: Payer: Self-pay | Admitting: Neurology

## 2012-07-08 DIAGNOSIS — Z8679 Personal history of other diseases of the circulatory system: Secondary | ICD-10-CM

## 2012-07-08 DIAGNOSIS — G43909 Migraine, unspecified, not intractable, without status migrainosus: Secondary | ICD-10-CM

## 2012-07-08 DIAGNOSIS — G441 Vascular headache, not elsewhere classified: Secondary | ICD-10-CM

## 2012-07-10 ENCOUNTER — Other Ambulatory Visit: Payer: Self-pay | Admitting: Neurology

## 2012-07-10 ENCOUNTER — Ambulatory Visit
Admission: RE | Admit: 2012-07-10 | Discharge: 2012-07-10 | Disposition: A | Discharge status: Home / Self Care | Payer: Medicaid Other | Source: Ambulatory Visit | Attending: Neurology | Admitting: Neurology

## 2012-07-10 DIAGNOSIS — G441 Vascular headache, not elsewhere classified: Secondary | ICD-10-CM

## 2012-07-10 DIAGNOSIS — Z8679 Personal history of other diseases of the circulatory system: Secondary | ICD-10-CM

## 2012-07-10 DIAGNOSIS — G43909 Migraine, unspecified, not intractable, without status migrainosus: Secondary | ICD-10-CM

## 2012-07-10 MED ORDER — IOHEXOL 350 MG/ML SOLN
100.0000 mL | Freq: Once | INTRAVENOUS | Status: AC | PRN
  Administered 2012-07-10: 100 mL via INTRAVENOUS

## 2012-11-23 IMAGING — US US TRANSVAGINAL NON-OB
1 series · 13 of 25 positions shown · non-contrast
Comparison: 02/15/2009

CLINICAL DATA: Pelvic pain.  Postmenopausal bleeding



[Series 1: us pelvis complete · 87 acquisitions, 13 frames shown]
[im 1/87]
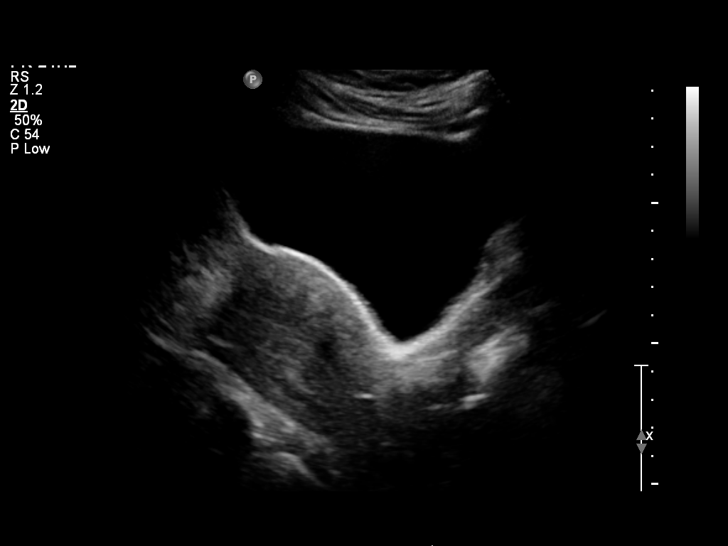
[im 8/87]
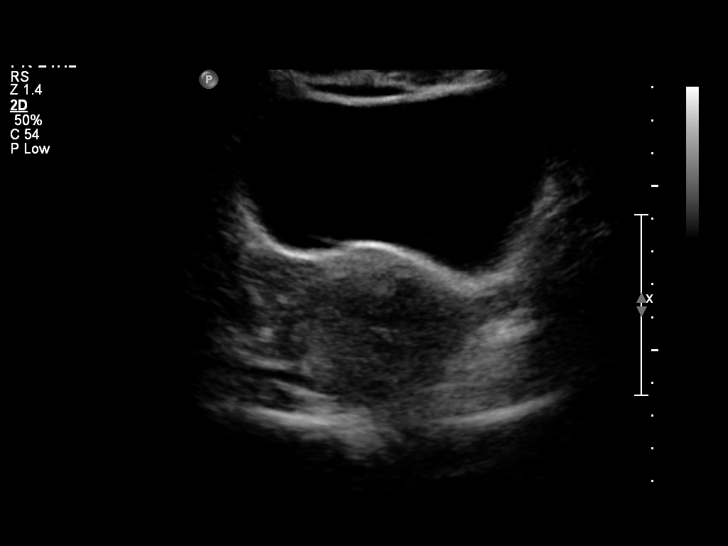
[im 15/87]
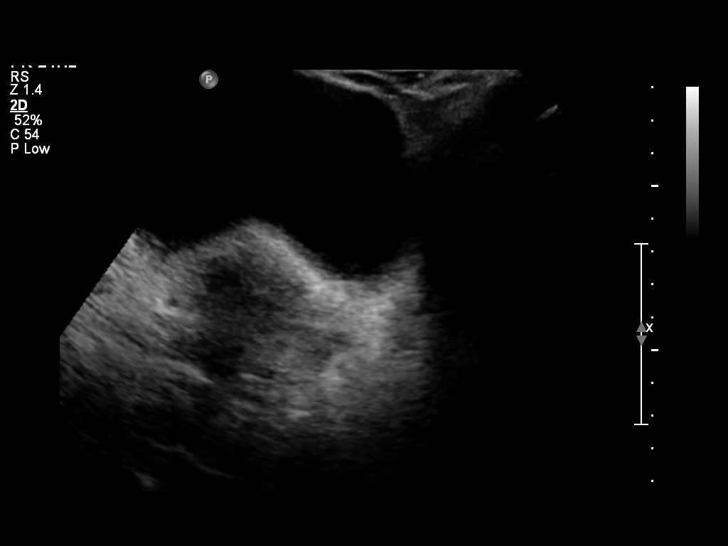
[im 22/87]
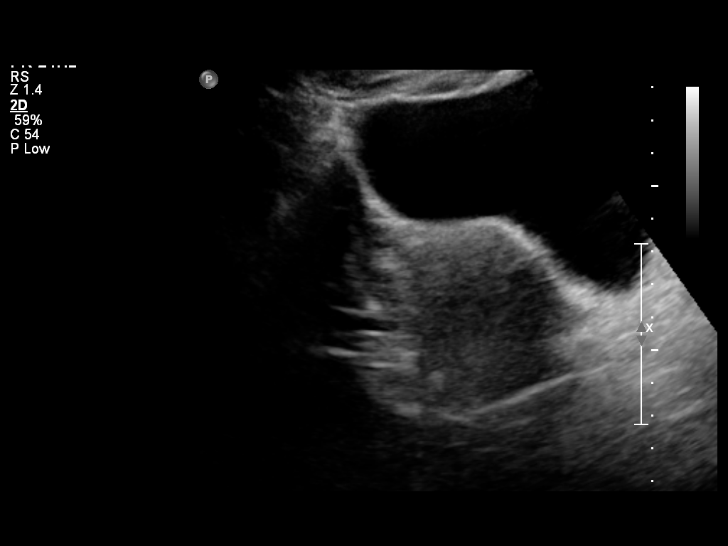
[im 29/87]
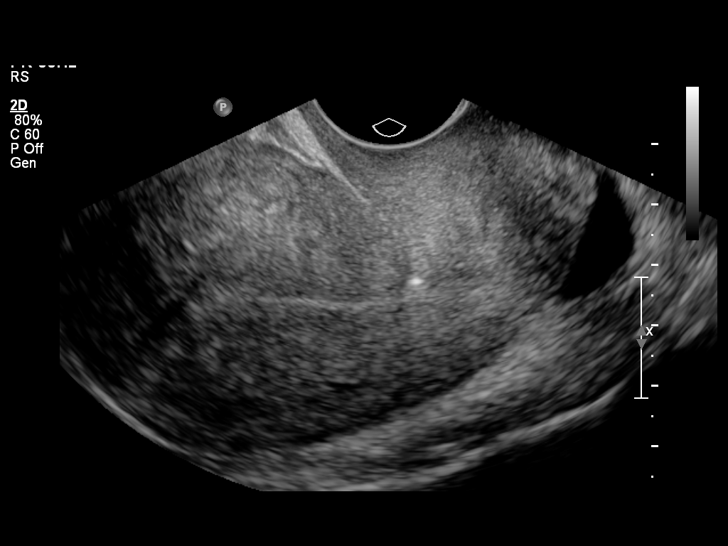
[im 36/87]
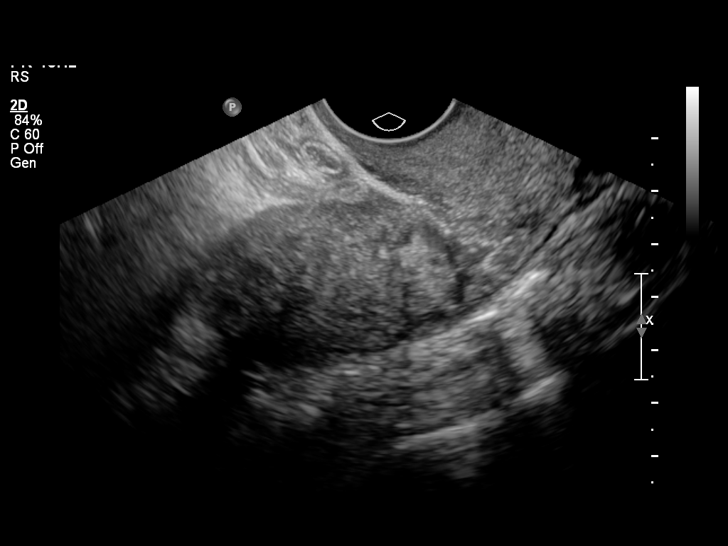
[im 44/87]
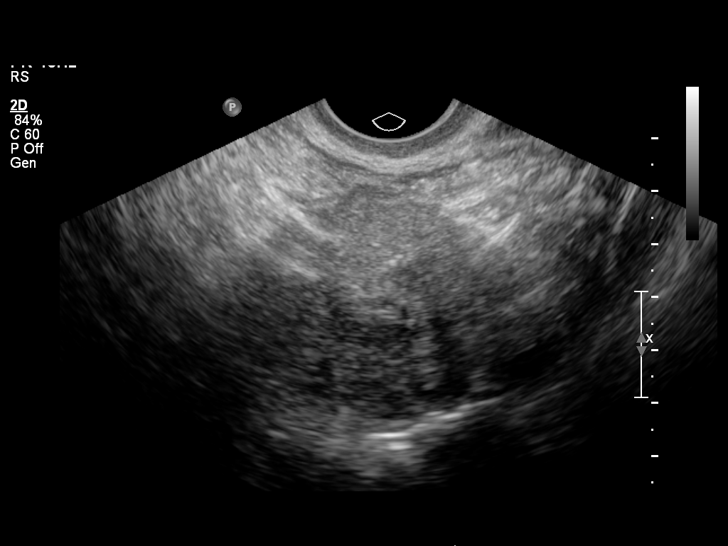
[im 51/87]
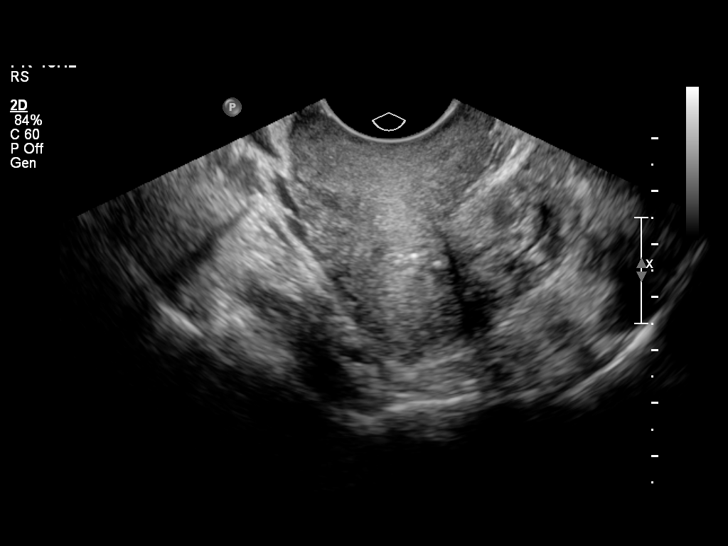
[im 58/87]
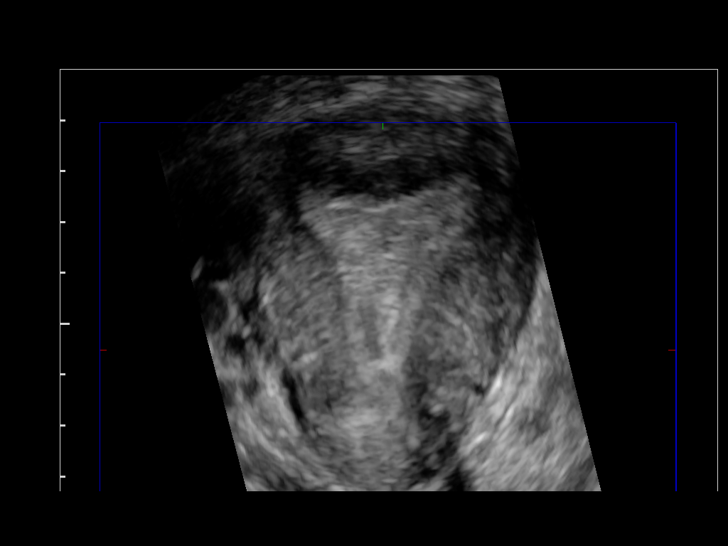
[im 65/87]
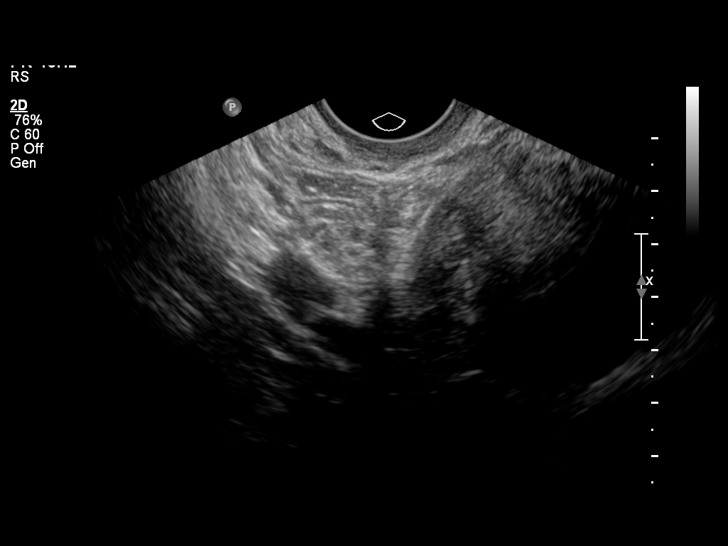
[im 72/87]
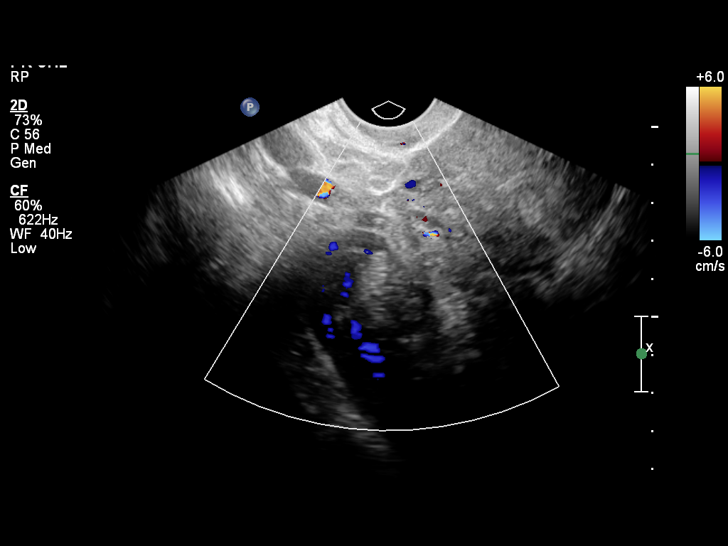
[im 79/87]
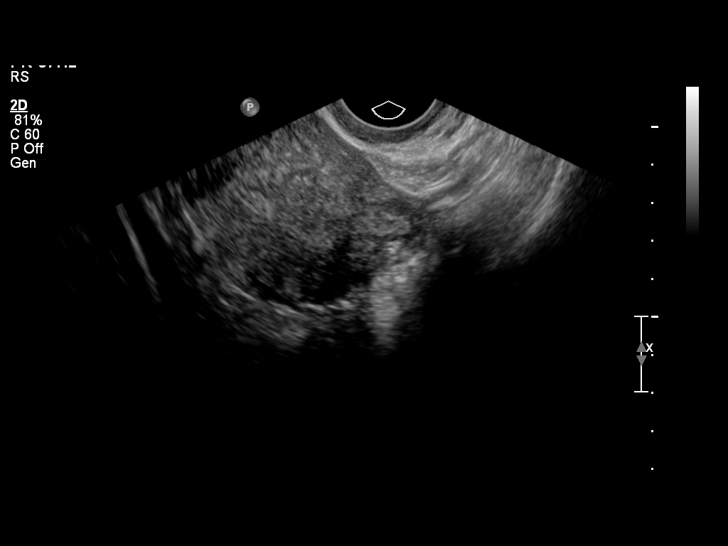
[im 87/87]
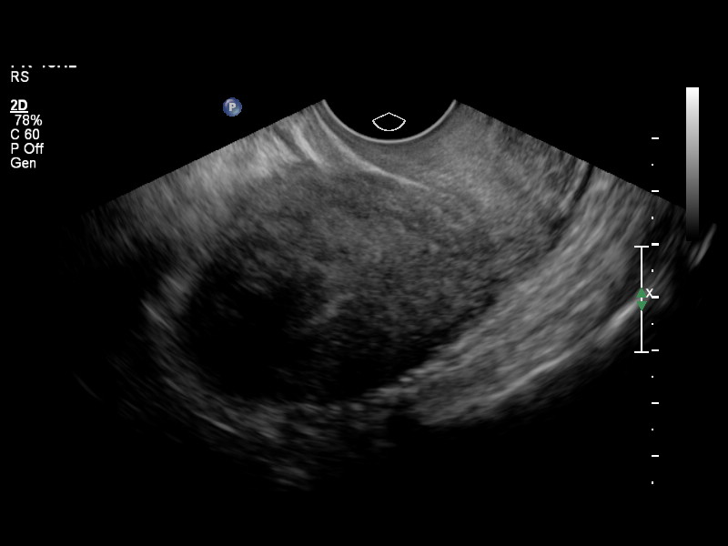

[13 of 25 positions shown; findings below may reference images not displayed]

FINDINGS: Uterus: The uterus demonstrates a sagittal length of 9.6 cm, depth
of 4.9 cm and width of 5.1 cm. The uterine myometrium is
heterogeneous and demonstrates several small mural fibroids on the
order of 1-2 cm in size.

Endometrium: Demonstrates an area of focal echogenic thickening in
the region of the right fundus measuring 1.0 x 0.7 by 1.2 cm.  This
shows questionable vascularity from a feeding vessel with power
Doppler exam and the appearance is most suspicious for a focal
polyp.  The remainder of the lining appears thin.

Right ovary:  Is not seen with confidence either transabdominally
or endovaginally

Left ovary: Has a normal appearance measuring 2.7 x 1.2 x 1.0 cm

Other findings: A trace of simple free fluid is noted in the cul-de-
sac.
IMPRESSION: Area of focal endometrial thickening with questionable flow from a
feeding vessel suspicious for focal polyp. In a postmenopausal
patient experiencing vaginal bleeding endometrial carcinoma cannot
be excluded with certainty and further assessment with tissue
sampling is warranted.

Normal left ovary and non-visualized right ovary

Multiple small mural fibroids.

## 2013-01-21 ENCOUNTER — Ambulatory Visit (INDEPENDENT_AMBULATORY_CARE_PROVIDER_SITE_OTHER): Payer: Medicaid Other | Admitting: Ophthalmology

## 2013-02-03 ENCOUNTER — Ambulatory Visit (INDEPENDENT_AMBULATORY_CARE_PROVIDER_SITE_OTHER): Payer: Self-pay | Admitting: Ophthalmology

## 2013-02-22 ENCOUNTER — Other Ambulatory Visit: Payer: Self-pay

## 2013-02-22 DIAGNOSIS — Z1231 Encounter for screening mammogram for malignant neoplasm of breast: Secondary | ICD-10-CM

## 2013-03-03 ENCOUNTER — Ambulatory Visit (INDEPENDENT_AMBULATORY_CARE_PROVIDER_SITE_OTHER): Payer: Medicaid Other | Admitting: Ophthalmology

## 2013-03-30 ENCOUNTER — Ambulatory Visit
Admission: RE | Admit: 2013-03-30 | Discharge: 2013-03-30 | Disposition: A | Discharge status: Home / Self Care | Payer: Medicaid Other | Source: Ambulatory Visit

## 2013-03-30 DIAGNOSIS — Z1231 Encounter for screening mammogram for malignant neoplasm of breast: Secondary | ICD-10-CM

## 2013-03-31 ENCOUNTER — Ambulatory Visit: Payer: Self-pay | Admitting: Nurse Practitioner

## 2013-05-04 ENCOUNTER — Ambulatory Visit: Payer: Medicaid Other | Admitting: Nurse Practitioner

## 2013-06-11 ENCOUNTER — Telehealth: Payer: Self-pay | Admitting: Nurse Practitioner

## 2013-06-11 MED ORDER — TOPIRAMATE 25 MG PO TABS: 75.0000 mg | ORAL_TABLET | Freq: Two times a day (BID) | ORAL | Status: DC

## 2013-06-11 NOTE — Telephone Encounter (Signed)
We do not prescribe Triamterine.  Patient needs Topiramate.

## 2013-07-29 ENCOUNTER — Encounter: Payer: Self-pay | Admitting: Nurse Practitioner

## 2013-07-29 ENCOUNTER — Ambulatory Visit (INDEPENDENT_AMBULATORY_CARE_PROVIDER_SITE_OTHER): Payer: Medicaid Other | Admitting: Nurse Practitioner

## 2013-07-29 VITALS — BP 156/84 | HR 66 | Ht 60.5 in | Wt 180.0 lb

## 2013-07-29 DIAGNOSIS — I10 Essential (primary) hypertension: Secondary | ICD-10-CM

## 2013-07-29 DIAGNOSIS — F341 Dysthymic disorder: Secondary | ICD-10-CM

## 2013-07-29 DIAGNOSIS — G43909 Migraine, unspecified, not intractable, without status migrainosus: Secondary | ICD-10-CM | POA: Insufficient documentation

## 2013-07-29 HISTORY — DX: Dysthymic disorder: F34.1

## 2013-07-29 NOTE — Progress Notes (Signed)
[facility]  PATIENT: AMIT MELOY DOB: 11-25-1960   REASON FOR VISIT: Followup for headaches   HISTORY OF PRESENT ILLNESS:Ms Dlouhy, 52 yr old returns for followup. She has a history of headaches bifrontal and throbbing at times she did not bring her headache calendar today. She was last seen in this office 09/09/2012. She is currently on Topamax tolerating the medication without difficulty and called in to the office in August and Dr. Vickey Huger increased her dose to 75 mg twice daily from 100 daily. She says she has not been sleeping well, she wakes up several times during the night, she recently has been waking up with headaches. She also has history of  significant depression and sees psych as well as Veterinary surgeon. Her B/P has been poorly controlled.  CT angiogram  06/2012 was normal. She continues to smoke  depression meds have been changed since last seen. She has cut out some foods which she felt were triggers.   HISTORY: afro-american female patient of Dr Georganna Skeans reports  she is to be seen for Headahces, whicih have bothered her for over 12 years. Sounds and Lights are bother ing her , bifrontal headaches and sharp pains in the right corona and parietal area. A stabbing  quality , and other times throbbing.She was initially evaluated by Dr. Vickey Huger 06/02/12.  The pain moves around, HA affec her altl day long and every day, there  is always a alatnet pain, and she has not been finding the triggers, Her dentist and her eye doctor checked her out and there is no contribution from either.  she has  not noted olfactory triggers, weather changes or sleep deprivation.   She has hypertension and a family history of heart disease, hypertension, and aneurysm.  She reports palpitation.      REVIEW OF SYSTEMS: Full 14 system review of systems performed and notable only for:  Constitutional: Weight loss Cardiovascular: Chest pain  Ear/Nose/Throat: N/A  Skin: N/A    Eyes: N/A  Respiratory: Shortness of breath Gastroitestinal: N/A  Hematology/Lymphatic: Easy bruising  Endocrine: Feeling hot Musculoskeletal: Joint pain Allergy/Immunology: N/A  Neurological: Headache Psychiatric: Depression anxiety sleepiness   ALLERGIES: No Known Allergies  HOME MEDICATIONS: Outpatient Prescriptions Prior to Visit  Medication Sig Dispense Refill  . ARIPiprazole (ABILIFY) 5 MG tablet Take 5 mg by mouth daily.      Marland Kitchen losartan (COZAAR) 100 MG tablet Take 100 mg by mouth daily.      . Multiple Vitamins-Minerals (ONE-A-DAY 50 PLUS PO) Take 1 tablet by mouth.      . propranolol ER (INDERAL LA) 120 MG 24 hr capsule Take 120 mg by mouth daily.      Marland Kitchen topiramate (TOPAMAX) 25 MG tablet Take 3 tablets (75 mg total) by mouth 2 (two) times daily.  180 tablet  1  . traMADol (ULTRAM) 50 MG tablet Take 50 mg by mouth every 6 (six) hours as needed.      . triamterene-hydrochlorothiazide (MAXZIDE) 75-50 MG per tablet Take 1 tablet by mouth daily.        . citalopram (CELEXA) 20 MG tablet Take 20 mg by mouth daily.        . clonazePAM (KLONOPIN) 1 MG tablet Take 1 mg by mouth 2 (two) times daily as needed.      Marland Kitchen escitalopram (LEXAPRO) 20 MG tablet Take 20 mg by mouth daily.      . promethazine (PHENERGAN) 25 MG tablet Take 25 mg by mouth as needed.  No facility-administered medications prior to visit.    PAST MEDICAL HISTORY: Past Medical History  Diagnosis Date  . Hypertension   . Anemia   . Blood transfusion 21 years ago  . Thyroid disease   . 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 Fracture surgery      rods in leg 19 years ago  . Eye surgery      FAMILY HISTORY: Family History  Problem Relation Age of Onset  . Hypertension Father   . Hypertension Paternal Grandmother     SOCIAL HISTORY: History   Social History  . Marital Status: Single    Spouse Name: N/A    Number of  Children: 2  . Years of Education: 12   Occupational History  . Not on file.   Social History Main Topics  . Smoking status: Current Every Day Smoker -- 0.50 packs/day for 30 years  . Smokeless tobacco: Never Used  . Alcohol Use: No  . Drug Use: No  . Sexual Activity: Not Currently    Birth Control/ Protection: None   Other Topics Concern  . Not on file   Social History Narrative  . No narrative on file     PHYSICAL EXAM  Filed Vitals:   07/29/13 1415  BP: 156/84  Pulse: 66  Height: 5' 0.5" (1.537 m)  Weight: 180 lb (81.647 kg)   Body mass index is 34.56 kg/(m^2).  Generalized: Well developed, in no acute distress  Head: normocephalic and atraumatic,. Oropharynx benign  Neck: Supple, no carotid bruits  Cardiac: Regular rate rhythm, no murmur  Musculoskeletal: No deformity   Neurological examination   Mentation: Alert oriented to time, place, history taking. Follows all commands speech and language fluent. ESS 6, neck 16  Cranial Nerves II-XII:Pupils were equal round reactive to light extraocular movements were full, visual field were full on confrontational test. Facial sensation and strength were normal. hearing was intact to finger rubbing bilaterally. Uvula tongue midline. head turning and shoulder shrug and were normal and symmetric.Tongue protrusion into cheek strength was normal. Motor: normal bulk and tone, full strength in the BUE, BLE, fine finger movements normal, no pronator drift. No focal weakness Sensory: normal and symmetric to light touch, pinprick, and  vibration  Coordination: finger-nose-finger, heel-to-shin bilaterally, no dysmetria Reflexes: 1+ upper lower and symmetric Gait and Station: Rising up from seated position without assistance, normal stance,  moderate stride, good arm swing, smooth turning, able to perform tiptoe, and heel walking without difficulty. Tandem is normal  DIAGNOSTIC DATA (LABS, IMAGING, TESTING) -None to  review  ASSESSMENT AND PLAN  52 y.o. year old female  has a past medical history of Hypertension; Anemia; Blood transfusion (21 years ago); Thyroid disease; and Depress anxiety and headaches. Patient is now awakened  during the night with headaches.  Will check sleep study if positive next visit with Dr. Vickey Huger Continue Topamax at current dose   Nilda Riggs, [facility], [facility], APRN  [facility] [address], [address] [place], [place] [address] [phone] Patient to continue Topamax 3 tablets twice daily Will check for  sleep apnea If Positive followup visit with Dr. Vickey Huger

## 2013-07-29 NOTE — Patient Instructions (Addendum)
Will check sleep study if positive next visit with Dr. Vickey Huger Continue Topamax at current dose

## 2013-07-30 ENCOUNTER — Telehealth: Payer: Self-pay | Admitting: Nurse Practitioner

## 2013-07-30 NOTE — Telephone Encounter (Signed)
Patient wishes to hold off on having her sleep study at this time.  She will follow up with our office at a later date when her schedule will allow flexibility.  Darrol Angel, NP and Dr. Huston Foley have been notified.

## 2013-08-02 NOTE — Progress Notes (Signed)
Agree with work up and plan.  Lazarus Salines, MD agree with assessment and management as outlined in this  note.

## 2013-10-22 ENCOUNTER — Other Ambulatory Visit: Payer: Self-pay | Admitting: Neurology

## 2013-10-22 ENCOUNTER — Telehealth: Payer: Self-pay | Admitting: Neurology

## 2013-10-22 DIAGNOSIS — G4710 Hypersomnia, unspecified: Secondary | ICD-10-CM

## 2013-10-22 DIAGNOSIS — F329 Major depressive disorder, single episode, unspecified: Secondary | ICD-10-CM

## 2013-10-22 DIAGNOSIS — E65 Localized adiposity: Secondary | ICD-10-CM

## 2013-10-22 NOTE — Telephone Encounter (Signed)
. °  Darrol Angel, NP is referring [name], 52 y.o. female, for an attended sleep study.  Wt: 180 lbs Ht: 60 in. BMI: 34.56  Diagnoses: Migraine Excessive Daytime Sleepiness Snoring Witnessed Apneas HTN Thyroid Disease  Medication List: Current Outpatient Prescriptions  Medication Sig Dispense Refill   ARIPiprazole (ABILIFY) 5 MG tablet Take 5 mg by mouth daily.       losartan (COZAAR) 100 MG tablet Take 100 mg by mouth daily.       Multiple Vitamins-Minerals (ONE-A-DAY 50 PLUS PO) Take 1 tablet by mouth.       Omega 3-Lutein-Zeaxanthin (ADVANCED EYE HEALTH PO) Take by mouth 4 (four) times daily.       propranolol ER (INDERAL LA) 120 MG 24 hr capsule Take 120 mg by mouth daily.       sertraline (ZOLOFT) 50 MG tablet Take 50 mg by mouth daily.       topiramate (TOPAMAX) 25 MG tablet Take 3 tablets (75 mg total) by mouth 2 (two) times daily.  180 tablet  1   traMADol (ULTRAM) 50 MG tablet Take 50 mg by mouth every 6 (six) hours as needed.       triamterene-hydrochlorothiazide (MAXZIDE) 75-50 MG per tablet Take 1 tablet by mouth daily.         No current facility-administered medications for this visit.    This patient presents to Darrol Angel, NP in follow up for headaches.  Pt complains of excessive daytime sleepiness, wakes choking and gasping, admits to snoring.  She endorses 13 on [place].  Pt is obese with htn.  Darrol Angel, NP would like an attended study to rule out osa.  Insurance:  Medicaid - Prior authorization is not required.

## 2013-10-24 ENCOUNTER — Other Ambulatory Visit: Payer: Self-pay

## 2013-10-24 MED ORDER — TOPIRAMATE 25 MG PO TABS: 75.0000 mg | ORAL_TABLET | Freq: Two times a day (BID) | ORAL | Status: AC

## 2013-11-17 ENCOUNTER — Ambulatory Visit (INDEPENDENT_AMBULATORY_CARE_PROVIDER_SITE_OTHER): Payer: Medicaid Other

## 2013-11-17 DIAGNOSIS — R0989 Other specified symptoms and signs involving the circulatory and respiratory systems: Secondary | ICD-10-CM

## 2013-11-17 DIAGNOSIS — R0609 Other forms of dyspnea: Secondary | ICD-10-CM

## 2013-11-17 DIAGNOSIS — E65 Localized adiposity: Secondary | ICD-10-CM

## 2013-11-17 DIAGNOSIS — F32A Depression, unspecified: Secondary | ICD-10-CM

## 2013-11-17 DIAGNOSIS — G4710 Hypersomnia, unspecified: Secondary | ICD-10-CM

## 2013-11-17 DIAGNOSIS — F329 Major depressive disorder, single episode, unspecified: Secondary | ICD-10-CM

## 2013-11-25 ENCOUNTER — Telehealth: Payer: Self-pay | Admitting: Neurology

## 2013-11-25 NOTE — Telephone Encounter (Signed)
I called and spoke with the patient her recent sleep study results. I informed the patient that the study did not reveal evidence for significant central or obstructive sleep apnea, but loud snoring was noted. With sleep on the right side and supine. 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 Brett Fairy recommend oral appliance or ENT evaluation/procedure. I will fax a copy to Priscille Loveless, NP and Cecille Rubin, NP and mail a copy of the report to the patient.

## 2013-11-29 ENCOUNTER — Encounter: Payer: Self-pay | Admitting: *Deleted

## 2013-11-30 ENCOUNTER — Telehealth: Payer: Self-pay | Admitting: Neurology

## 2013-11-30 NOTE — Telephone Encounter (Signed)
Created by mistake

## 2013-12-01 ENCOUNTER — Ambulatory Visit: Payer: Self-pay | Admitting: Nurse Practitioner

## 2013-12-24 ENCOUNTER — Institutional Professional Consult (permissible substitution): Payer: Self-pay | Admitting: Neurology

## 2014-03-09 ENCOUNTER — Ambulatory Visit (INDEPENDENT_AMBULATORY_CARE_PROVIDER_SITE_OTHER): Payer: Medicaid Other | Admitting: Ophthalmology

## 2014-03-09 DIAGNOSIS — H43819 Vitreous degeneration, unspecified eye: Secondary | ICD-10-CM

## 2014-03-09 DIAGNOSIS — H35039 Hypertensive retinopathy, unspecified eye: Secondary | ICD-10-CM

## 2014-03-09 DIAGNOSIS — H2510 Age-related nuclear cataract, unspecified eye: Secondary | ICD-10-CM

## 2014-03-09 DIAGNOSIS — H33309 Unspecified retinal break, unspecified eye: Secondary | ICD-10-CM

## 2014-03-09 DIAGNOSIS — I10 Essential (primary) hypertension: Secondary | ICD-10-CM

## 2014-03-09 DIAGNOSIS — H3530 Unspecified macular degeneration: Secondary | ICD-10-CM

## 2014-07-08 ENCOUNTER — Telehealth: Payer: Self-pay | Admitting: Nurse Practitioner

## 2014-07-08 NOTE — Telephone Encounter (Signed)
Faxed patient's medical records to [facility] on 07/08/14.

## 2014-09-05 ENCOUNTER — Encounter: Payer: Self-pay | Admitting: Nurse Practitioner
